# Patient Record
Sex: Male | Born: 1970 | Race: White | Hispanic: No | Marital: Married | Smoking: Former smoker
Health system: Southern US, Community
[De-identification: ages and names within clinical notes are randomized; demographics above are authoritative.]

## PROBLEM LIST (undated history)

## (undated) DIAGNOSIS — I10 Essential (primary) hypertension: Secondary | ICD-10-CM

## (undated) DIAGNOSIS — K219 Gastro-esophageal reflux disease without esophagitis: Secondary | ICD-10-CM

## (undated) HISTORY — PX: NO PAST SURGERIES: SHX2092

## (undated) HISTORY — PX: APPENDECTOMY: SHX54

## (undated) HISTORY — DX: Gastro-esophageal reflux disease without esophagitis: K21.9

## (undated) HISTORY — DX: Essential (primary) hypertension: I10

---

## 2010-09-28 ENCOUNTER — Emergency Department (HOSPITAL_COMMUNITY)
Admission: EM | Admit: 2010-09-28 | Discharge: 2010-09-28 | Payer: Self-pay | Source: Home / Self Care | Admitting: Emergency Medicine

## 2010-10-03 DIAGNOSIS — R079 Chest pain, unspecified: Secondary | ICD-10-CM | POA: Insufficient documentation

## 2010-10-04 ENCOUNTER — Ambulatory Visit: Payer: Self-pay | Admitting: Cardiovascular Disease

## 2010-10-20 ENCOUNTER — Ambulatory Visit
Admission: RE | Admit: 2010-10-20 | Discharge: 2010-10-20 | Payer: Self-pay | Source: Home / Self Care | Attending: Cardiovascular Disease | Admitting: Cardiovascular Disease

## 2010-10-20 ENCOUNTER — Ambulatory Visit: Admission: RE | Admit: 2010-10-20 | Discharge: 2010-10-20 | Payer: Self-pay | Source: Home / Self Care

## 2010-10-27 ENCOUNTER — Ambulatory Visit: Admit: 2010-10-27 | Payer: Self-pay | Admitting: Internal Medicine

## 2010-11-07 ENCOUNTER — Ambulatory Visit: Admit: 2010-11-07 | Payer: Self-pay | Admitting: Cardiovascular Disease

## 2010-11-17 NOTE — Assessment & Plan Note (Signed)
Summary: chest pains/mt   Visit Type:  Initial Consult Primary Maiko Salais:  none  CC:  chest pain.  History of Present Illness: 40 yo WM with no past medical history who is here today cardiac evaluation. He tells me that he was seen in the [facility] ED on 09/28/10 with complaints of left hand tingling, left foot tingling and left chest pain. This pain felt like a heaviness over the left breast and then radiation into the right chest. This pain has been continuous over the last 6 days. His wife has noticed him snoring at night. He has not noticed shortness of breath when exerting himself. He has not had prior GERD. He does not recognize any GERD symptoms. He notices occasional palpitations but no sustained periods. EKG in the ED with NSR, no ischemic changes. Cardiac enzymes negative.   Past History:  Past Medical History: None  Past Surgical History: None  Family History: Mother-alive, healthy Father-alive, healthy 8 siblings-all healthy  Social History: Tobacco Use - No.  Alcohol Use - social Drug Use - no Married-3 children (13,11,8) Contractor-mostly desk work/supervising  Review of Systems       The patient complains of chest pain and palpitations.  The patient denies fatigue, malaise, fever, weight gain/loss, vision loss, decreased hearing, hoarseness, shortness of breath, prolonged cough, wheezing, sleep apnea, coughing up blood, abdominal pain, blood in stool, nausea, vomiting, diarrhea, heartburn, incontinence, blood in urine, muscle weakness, joint pain, leg swelling, rash, skin lesions, headache, fainting, dizziness, depression, anxiety, enlarged lymph nodes, easy bruising or bleeding, and environmental allergies.    Vital Signs:  Patient profile:   40 year old male Height:      72 inches Weight:      176 pounds BMI:     23.96 Pulse rate:   68 / minute Resp:     16 per minute BP sitting:   122 / 92  (left arm)  Vitals Entered By: Marrion Coy, CNA (October 04, 2010 9:10 AM)  Physical Exam  General:  General: Well developed, well nourished, NAD HEENT: OP clear, mucus membranes moist SKIN: warm, dry Neuro: No focal deficits Musculoskeletal: Muscle strength 5/5 all ext Psychiatric: Mood and affect normal Neck: No JVD, no carotid bruits, no thyromegaly, no lymphadenopathy. Lungs:Clear bilaterally, no wheezes, rhonci, crackles CV: RRR no murmurs, gallops rubs Abdomen: soft, NT, ND, BS present Extremities: No edema, pulses 2+.    EKG  Procedure date:  09/28/2010  Findings:      NSR, rate 69 bpm. Normal EKG  Impression & Recommendations:  Problem # 1:  CHEST PAIN (ICD-786.50) Atypical pain. Most likely non-cardiac. ? related to GERD. The pain has been present for 6 days. Most likely non-cardiac. Will start Protonix 40 mg by mouth Qdaily and will arrange exercise treadmill stress test. Follow up in 3-4 weeks.   Orders: Treadmill (Treadmill)  Patient Instructions: 1)  Start  Protonix 40mg  daily 2)  Your physician has requested that you have an exercise tolerance test.  For further information please visit [email or web address]  Please also follow instruction sheet, as given. 3)  Follow up in 4 weeks Prescriptions: PROTONIX 40 MG TBEC (PANTOPRAZOLE SODIUM) Take 1 tablet by mouth once a day  #30 x 6   Entered by:   Meredith Staggers, RN   Authorized by:   Verne Carrow, MD   Signed by:   Meredith Staggers, RN on 10/04/2010   Method used:   Print then Give to Patient   RxID:  [id number]    Appended Document: chest pains/mt    Clinical Lists Changes  Medications: Rx of PROTONIX 40 MG TBEC (PANTOPRAZOLE SODIUM) Take 1 tablet by mouth once a day;  #30 x 6;  Signed;  Entered by: Meredith Staggers, RN;  Authorized by: Verne Carrow, MD;  Method used: Electronically to [id number] [address], [place], [place]  [address], Ph: [phone] or [phone], Fax: [phone]    Prescriptions: PROTONIX 40 MG TBEC  (PANTOPRAZOLE SODIUM) Take 1 tablet by mouth once a day  #30 x 6   Entered by:   Meredith Staggers, RN   Authorized by:   Verne Carrow, MD   Signed by:   Meredith Staggers, RN on 10/04/2010   Method used:   Electronically to        [id number] (retail)       [address]       [place], [place]  [address]       Ph: [phone] or [phone]       Fax: [phone]   RxID:   [id number]

## 2010-12-27 LAB — COMPREHENSIVE METABOLIC PANEL
ALT: 21 U/L (ref 0–53)
AST: 21 U/L (ref 0–37)
BUN: 12 mg/dL (ref 6–23)
Calcium: 9.7 mg/dL (ref 8.4–10.5)
Creatinine, Ser: 0.94 mg/dL (ref 0.4–1.5)
GFR calc Af Amer: >60 mL/min (ref >60)
GFR calc non Af Amer: >60 mL/min (ref >60)
Glucose, Bld: 144 mg/dL — ABNORMAL HIGH (ref 70–99)
Potassium: 4.1 mEq/L (ref 3.5–5.1)
Sodium: 139 mEq/L (ref 135–145)
Total Bilirubin: 0.8 mg/dL (ref 0.3–1.2)
Total Protein: 7.2 g/dL (ref 6.0–8.3)

## 2010-12-27 LAB — CBC
HCT: 41.5 % (ref 39.0–52.0)
Hemoglobin: 13.6 g/dL (ref 13.0–17.0)
MCH: 30.6 pg (ref 26.0–34.0)
MCV: 93.3 fL (ref 78.0–100.0)
RBC: 4.45 MIL/uL (ref 4.22–5.81)
RDW: 13.3 % (ref 11.5–15.5)
WBC: 4.1 10*3/uL (ref 4.0–10.5)

## 2010-12-27 LAB — DIFFERENTIAL
Eosinophils Relative: 1 % (ref 0–5)
Lymphocytes Relative: 41 % (ref 12–46)
Monocytes Relative: 10 % (ref 3–12)
Neutro Abs: 2 10*3/uL (ref 1.7–7.7)

## 2010-12-27 LAB — CK TOTAL AND CKMB (NOT AT ARMC): CK, MB: 2.1 ng/mL (ref 0.3–4.0)

## 2014-10-22 ENCOUNTER — Other Ambulatory Visit (INDEPENDENT_AMBULATORY_CARE_PROVIDER_SITE_OTHER): Payer: BLUE CROSS/BLUE SHIELD

## 2014-10-22 ENCOUNTER — Ambulatory Visit (INDEPENDENT_AMBULATORY_CARE_PROVIDER_SITE_OTHER): Payer: BLUE CROSS/BLUE SHIELD | Admitting: Family

## 2014-10-22 ENCOUNTER — Encounter: Payer: Self-pay | Admitting: Family

## 2014-10-22 VITALS — BP 148/102 | HR 89 | Temp 98.0°F | Resp 18 | Ht 72.0 in | Wt 178.4 lb

## 2014-10-22 DIAGNOSIS — R21 Rash and other nonspecific skin eruption: Secondary | ICD-10-CM | POA: Diagnosis not present

## 2014-10-22 DIAGNOSIS — Z0000 Encounter for general adult medical examination without abnormal findings: Secondary | ICD-10-CM | POA: Insufficient documentation

## 2014-10-22 DIAGNOSIS — R42 Dizziness and giddiness: Secondary | ICD-10-CM | POA: Diagnosis not present

## 2014-10-22 DIAGNOSIS — R5383 Other fatigue: Secondary | ICD-10-CM | POA: Diagnosis not present

## 2014-10-22 LAB — CBC
HCT: 43 % (ref 39.0–52.0)
HEMOGLOBIN: 14 g/dL (ref 13.0–17.0)
MCHC: 32.5 g/dL (ref 30.0–36.0)
MCV: 92 fl (ref 78.0–100.0)
PLATELETS: 272 10*3/uL (ref 150.0–400.0)
RBC: 4.68 Mil/uL (ref 4.22–5.81)
RDW: 13.2 % (ref 11.5–15.5)
WBC: 7.6 10*3/uL (ref 4.0–10.5)

## 2014-10-22 LAB — TSH: TSH: 1.03 u[IU]/mL (ref 0.35–4.50)

## 2014-10-22 LAB — BASIC METABOLIC PANEL
BUN: 15 mg/dL (ref 6–23)
CALCIUM: 9.7 mg/dL (ref 8.4–10.5)
CO2: 30 mEq/L (ref 19–32)
CREATININE: 0.9 mg/dL (ref 0.4–1.5)
Chloride: 100 mEq/L (ref 96–112)
GFR: 98.22 mL/min (ref >60.00)
Glucose, Bld: 101 mg/dL — ABNORMAL HIGH (ref 70–99)
Potassium: 3.9 mEq/L (ref 3.5–5.1)
SODIUM: 138 meq/L (ref 135–145)

## 2014-10-22 LAB — RPR

## 2014-10-22 LAB — HEMOGLOBIN A1C: HEMOGLOBIN A1C: 6.5 % (ref 4.6–6.5)

## 2014-10-22 NOTE — Patient Instructions (Addendum)
Thank you for choosing [facility].  Summary/Instructions:  Please stop by the lab on the basement level of the building for your blood work. Your results will be released to MyChart (or called to you) after review, usually within 72 hours after test completion. If any changes need to be made, you will be notified at that same time.  If your symptoms worsen or fail to improve, please contact our office for further instruction, or in case of emergency go directly to the emergency room at the closest medical facility.   Health Maintenance A healthy lifestyle and preventative care can promote health and wellness.  Maintain regular health, dental, and eye exams.  Eat a healthy diet. Foods like vegetables, fruits, whole grains, low-fat dairy products, and lean protein foods contain the nutrients you need and are low in calories. Decrease your intake of foods high in solid fats, added sugars, and salt. Get information about a proper diet from your health care provider, if necessary.  Regular physical exercise is one of the most important things you can do for your health. Most adults should get at least 150 minutes of moderate-intensity exercise (any activity that increases your heart rate and causes you to sweat) each week. In addition, most adults need muscle-strengthening exercises on 2 or more days a week.   Maintain a healthy weight. The body mass index (BMI) is a screening tool to identify possible weight problems. It provides an estimate of body fat based on height and weight. Your health care provider can find your BMI and can help you achieve or maintain a healthy weight. For males 20 years and older:  A BMI below 18.5 is considered underweight.  A BMI of 18.5 to 24.9 is normal.  A BMI of 25 to 29.9 is considered overweight.  A BMI of 30 and above is considered obese.  Maintain normal blood lipids and cholesterol by exercising and minimizing your intake of saturated fat. Eat a  balanced diet with plenty of fruits and vegetables. Blood tests for lipids and cholesterol should begin at age 20 and be repeated every 5 years. If your lipid or cholesterol levels are high, you are over age 50, or you are at high risk for heart disease, you may need your cholesterol levels checked more frequently.Ongoing high lipid and cholesterol levels should be treated with medicines if diet and exercise are not working.  If you smoke, find out from your health care provider how to quit. If you do not use tobacco, do not start.  Lung cancer screening is recommended for adults aged 55-80 years who are at high risk for developing lung cancer because of a history of smoking. A yearly low-dose CT scan of the lungs is recommended for people who have at least a 30-pack-year history of smoking and are current smokers or have quit within the past 15 years. A pack year of smoking is smoking an average of 1 pack of cigarettes a day for 1 year (for example, a 30-pack-year history of smoking could mean smoking 1 pack a day for 30 years or 2 packs a day for 15 years). Yearly screening should continue until the smoker has stopped smoking for at least 15 years. Yearly screening should be stopped for people who develop a health problem that would prevent them from having lung cancer treatment.  If you choose to drink alcohol, do not have more than 2 drinks per day. One drink is considered to be 12 oz (360 mL) of beer,   5 oz (150 mL) of wine, or 1.5 oz (45 mL) of liquor.  Avoid the use of street drugs. Do not share needles with anyone. Ask for help if you need support or instructions about stopping the use of drugs.  High blood pressure causes heart disease and increases the risk of stroke. Blood pressure should be checked at least every 1-2 years. Ongoing high blood pressure should be treated with medicines if weight loss and exercise are not effective.  If you are 45-79 years old, ask your health care provider if  you should take aspirin to prevent heart disease.  Diabetes screening involves taking a blood sample to check your fasting blood sugar level. This should be done once every 3 years after age 45 if you are at a normal weight and without risk factors for diabetes. Testing should be considered at a younger age or be carried out more frequently if you are overweight and have at least 1 risk factor for diabetes.  Colorectal cancer can be detected and often prevented. Most routine colorectal cancer screening begins at the age of 50 and continues through age 75. However, your health care provider may recommend screening at an earlier age if you have risk factors for colon cancer. On a yearly basis, your health care provider may provide home test kits to check for hidden blood in the stool. A small camera at the end of a tube may be used to directly examine the colon (sigmoidoscopy or colonoscopy) to detect the earliest forms of colorectal cancer. Talk to your health care provider about this at age 50 when routine screening begins. A direct exam of the colon should be repeated every 5-10 years through age 75, unless early forms of precancerous polyps or small growths are found.  People who are at an increased risk for hepatitis B should be screened for this virus. You are considered at high risk for hepatitis B if:  You were born in a country where hepatitis B occurs often. Talk with your health care provider about which countries are considered high risk.  Your parents were born in a high-risk country and you have not received a shot to protect against hepatitis B (hepatitis B vaccine).  You have HIV or AIDS.  You use needles to inject street drugs.  You live with, or have sex with, someone who has hepatitis B.  You are a man who has sex with other men (MSM).  You get hemodialysis treatment.  You take certain medicines for conditions like cancer, organ transplantation, and autoimmune  conditions.  Hepatitis C blood testing is recommended for all people born from 1945 through 1965 and any individual with known risk factors for hepatitis C.  Healthy men should no longer receive prostate-specific antigen (PSA) blood tests as part of routine cancer screening. Talk to your health care provider about prostate cancer screening.  Testicular cancer screening is not recommended for adolescents or adult males who have no symptoms. Screening includes self-exam, a health care provider exam, and other screening tests. Consult with your health care provider about any symptoms you have or any concerns you have about testicular cancer.  Practice safe sex. Use condoms and avoid high-risk sexual practices to reduce the spread of sexually transmitted infections (STIs).  You should be screened for STIs, including gonorrhea and chlamydia if:  You are sexually active and are younger than 24 years.  You are older than 24 years, and your health care provider tells you that you are at   risk for this type of infection.  Your sexual activity has changed since you were last screened, and you are at an increased risk for chlamydia or gonorrhea. Ask your health care provider if you are at risk.  If you are at risk of being infected with HIV, it is recommended that you take a prescription medicine daily to prevent HIV infection. This is called pre-exposure prophylaxis (PrEP). You are considered at risk if:  You are a man who has sex with other men (MSM).  You are a heterosexual man who is sexually active with multiple partners.  You take drugs by injection.  You are sexually active with a partner who has HIV.  Talk with your health care provider about whether you are at high risk of being infected with HIV. If you choose to begin PrEP, you should first be tested for HIV. You should then be tested every 3 months for as long as you are taking PrEP.  Use sunscreen. Apply sunscreen liberally and  repeatedly throughout the day. You should seek shade when your shadow is shorter than you. Protect yourself by wearing long sleeves, pants, a wide-brimmed hat, and sunglasses year round whenever you are outdoors.  Tell your health care provider of new moles or changes in moles, especially if there is a change in shape or color. Also, tell your health care provider if a mole is larger than the size of a pencil eraser.  A one-time screening for abdominal aortic aneurysm (AAA) and surgical repair of large AAAs by ultrasound is recommended for men aged 65-75 years who are current or former smokers.  Stay current with your vaccines (immunizations). Document Released: 03/30/2008 Document Revised: 10/07/2013 Document Reviewed: 02/27/2011 ExitCare Patient Information 2015 [employer]. This information is not intended to replace advice given to you by your health care provider. Make sure you discuss any questions you have with your health care provider.  

## 2014-10-22 NOTE — Progress Notes (Signed)
Subjective:    Patient ID: William SquibbChristopher P Adelsberger, male    DOB: 1971-09-05, 44 y.o.   MRN: [id number]  Chief Complaint  Patient presents with  . Establish Care    wants a physical, not fasting    HPI:  William Daniel is a 44 y.o. male who presents today for an annual wellness visit.   1) Health Maintenance - Rates overall good/ok health   Diet - regular diet; fairly poor and has room for improvements; is working on it; 50% eating out Exercise - Occasional - likes to run; usually runs 10-20 miles per week.    2) Preventative Exams / Immunizations:  Dental -- Due for exam Vision -- Due for exam   Health Maintenance  Topic Date Due  . INFLUENZA VACCINE  07/17/2015 (Originally 05/16/2014)  . TETANUS/TDAP  09/16/2022   There is no immunization history on file for this patient.   1) Dizziness -  Indicates symptoms of dizziness consistent with lightheadedness and possible feeling disconnected from the environment. Occurs when he is standing up. Has not tried any treatments and is unsure of any alleviating factors.   2) STD - Itchy rash like symptoms started about a year ago and  in the groin area and on his penis. Has not tried any treatments or been evaluated for this condition. Also noted he had an open spot on the inside of his mouth. Has never been tested for anything. Denies any discharge or pain. Marland Kitchen.  2) Fatigue - Has been going on for about 2 months; Describes overall general feelings of tiredness.   No Known Allergies  No current outpatient prescriptions on file prior to visit.   No current facility-administered medications on file prior to visit.    History reviewed. No pertinent past medical history.  History reviewed. No pertinent past surgical history.  Family History  Problem Relation Age of Onset  . Diabetes Father   . Heart disease Father   . Alcohol abuse Paternal Grandfather     History   Social History  . Marital Status: Married    Spouse  Name: N/A    Number of Children: 3  . Years of Education: 16   Occupational History  . Self Employed    Social History Main Topics  . Smoking status: Former Smoker    Types: Cigarettes  . Smokeless tobacco: Former NeurosurgeonUser    Types: Chew  . Alcohol Use: 0.0 oz/week    0 Not specified per week     Comment: occasional  . Drug Use: No  . Sexual Activity: Not on file   Other Topics Concern  . Not on file   Social History Narrative   [place] in [place] and raised in [place]. Currently resides in a house with wife and children. 2 dogs. Fun:  Play sports, run, coach basketball.   Denies religious beliefs effecting healthcare.     Review of Systems  Constitutional: Denies fever, chills or significant weight gain/loss. HENT: Head: Denies headache or neck pain Ears: Denies changes in hearing, ringing in ears, earache, drainage Nose: Denies discharge, stuffiness, itching, nosebleed, sinus pain Throat: Denies sore throat, hoarseness, dry mouth, sores, thrush Eyes: Denies loss/changes in vision, pain, redness, blurry/double vision, flashing lights Cardiovascular: Denies chest pain/discomfort, tightness, palpitations, shortness of breath with activity, difficulty lying down, swelling, sudden awakening with shortness of breath Respiratory: Denies shortness of breath, cough, sputum production, wheezing Gastrointestinal: Denies dysphasia, heartburn, change in appetite, nausea, change in bowel habits, rectal bleeding,  constipation, diarrhea, yellow skin or eyes Genitourinary: Denies frequency, urgency, burning/pain, blood in urine, incontinence, change in urinary strength. Musculoskeletal: Denies muscle/joint pain, stiffness, back pain, redness or swelling of joints, trauma Skin: Denies lumps, dryness, color changes, or hair/nail changes Neurological: Denies  fainting, seizures, weakness, numbness, tingling, tremor Psychiatric - Denies nervousness, stress, depression or memory loss Endocrine: Denies heat or  cold intolerance, sweating, frequent urination, excessive thirst, changes in appetite Hematologic: Denies ease of bruising or bleeding     Objective:     BP 148/102 mmHg  Pulse 89  Temp(Src) 98 F (36.7 C) (Oral)  Resp 18  Ht 6' (1.829 m)  Wt 178 lb 6.4 oz (80.922 kg)  BMI 24.19 kg/m2  SpO2 97% Nursing note and vital signs reviewed.  Physical Exam  Constitutional: He is oriented to person, place, and time. He appears well-developed and well-nourished.  HENT:  Head: Normocephalic.  Right Ear: Hearing, tympanic membrane, external ear and ear canal normal.  Left Ear: Hearing, tympanic membrane, external ear and ear canal normal.  Nose: Nose normal.  Mouth/Throat: Uvula is midline, oropharynx is clear and moist and mucous membranes are normal.  Eyes: Conjunctivae and EOM are normal. Pupils are equal, round, and reactive to light.  Neck: Neck supple. No JVD present. No tracheal deviation present. No thyromegaly present.  Cardiovascular: Normal rate, regular rhythm, normal heart sounds and intact distal pulses.   Pulmonary/Chest: Effort normal and breath sounds normal.  Abdominal: Soft. Bowel sounds are normal. He exhibits no distension and no mass. There is no tenderness. There is no rebound and no guarding.  Genitourinary:  Several small ruptured vesicles along anterior and posterior shaft of penis. No discharge noted.   Musculoskeletal: Normal range of motion. He exhibits no edema or tenderness.  Lymphadenopathy:    He has no cervical adenopathy.  Neurological: He is alert and oriented to person, place, and time. He has normal reflexes. No cranial nerve deficit. He exhibits normal muscle tone. Coordination normal.  Skin: Skin is warm and dry.  Psychiatric: He has a normal mood and affect. His behavior is normal. Judgment and thought content normal.       Assessment & Plan:

## 2014-10-22 NOTE — Assessment & Plan Note (Signed)
1) Anticipatory Guidance: Discussed importance of wearing a seatbelt while driving and not texting while driving; changing batteries in smoke detector at least once annually; wearing suntan lotion when outside; eating a balanced and moderate diet; getting physical activity at least 30 minutes per day.  2) Immunizations / Screenings / Labs:  Patient declines flu shot. All other immunizations are up to date per recommendations. All preventative screenings are up to date per recommendations.  btain CBC, BMET,and TSH.   Overall well exam. Patient risk factors include risky sexual behavior for which he is being tested. Discussed importance of nutrient density and re-starting his physical activity. Follow up prevention exam in 1 year or sooner, pending lab work.

## 2014-10-22 NOTE — Assessment & Plan Note (Signed)
Idiopathic at this time given normal physical exam. Obtain BMET and CBC. Consider further cardiac evaluation if events continue to occur.

## 2014-10-22 NOTE — Assessment & Plan Note (Signed)
No obvious cause of fatigue. Obtain BMET, CBC, A1c and TSH to rule out metabolic causes. If no results are evident consider referral to cardiology for further evaluation and possible anxiety or depression.

## 2014-10-22 NOTE — Assessment & Plan Note (Signed)
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 Obtain STD lab work - GC/Chlamydia, RPR, HIV and HSV 1/2. Continue supportive care at this time. If new outbreaks occur or would like prevention consider starting valacyclovir or acyclovir. Follow up if symptoms worsen or fail to improve.

## 2014-10-22 NOTE — Progress Notes (Signed)
Pre visit review using our clinic review tool, if applicable. No additional management support is needed unless otherwise documented below in the visit note. 

## 2014-10-23 ENCOUNTER — Encounter: Payer: Self-pay | Admitting: Family

## 2014-10-23 LAB — HSV 2 ANTIBODY, IGG: HSV 2 Glycoprotein G Ab, IgG: <0.1 IV

## 2014-10-23 LAB — HSV 1 ANTIBODY, IGG: HSV 1 Glycoprotein G Ab, IgG: 1.74 IV — ABNORMAL HIGH

## 2014-10-23 LAB — HIV ANTIBODY (ROUTINE TESTING W REFLEX): HIV: NONREACTIVE

## 2014-10-24 LAB — GC/CHLAMYDIA PROBE AMP, URINE
CHLAMYDIA, SWAB/URINE, PCR: NEGATIVE
GC Probe Amp, Urine: NEGATIVE

## 2015-01-22 ENCOUNTER — Ambulatory Visit (INDEPENDENT_AMBULATORY_CARE_PROVIDER_SITE_OTHER): Payer: BLUE CROSS/BLUE SHIELD | Admitting: Physician Assistant

## 2015-01-22 VITALS — BP 138/96 | HR 65 | Temp 97.7°F | Resp 16 | Ht 71.0 in | Wt 182.2 lb

## 2015-01-22 DIAGNOSIS — R030 Elevated blood-pressure reading, without diagnosis of hypertension: Secondary | ICD-10-CM

## 2015-01-22 DIAGNOSIS — R490 Dysphonia: Secondary | ICD-10-CM

## 2015-01-22 DIAGNOSIS — I10 Essential (primary) hypertension: Secondary | ICD-10-CM

## 2015-01-22 DIAGNOSIS — R070 Pain in throat: Secondary | ICD-10-CM

## 2015-01-22 DIAGNOSIS — R079 Chest pain, unspecified: Secondary | ICD-10-CM

## 2015-01-22 DIAGNOSIS — IMO0001 Reserved for inherently not codable concepts without codable children: Secondary | ICD-10-CM

## 2015-01-22 HISTORY — DX: Essential (primary) hypertension: I10

## 2015-01-22 MED ORDER — OMEPRAZOLE 20 MG PO CPDR
20.0000 mg | DELAYED_RELEASE_CAPSULE | Freq: Every day | ORAL | Status: DC
Start: 2015-01-22 — End: 2015-04-05

## 2015-01-22 MED ORDER — HYDROCHLOROTHIAZIDE 12.5 MG PO CAPS: 12.5000 mg | ORAL_CAPSULE | Freq: Every day | ORAL | Status: DC

## 2015-01-22 NOTE — Progress Notes (Signed)
Subjective:    Patient ID: William Daniel, male    DOB: 1971/05/31, 44 y.o.   MRN: [id number]  HPI  This is a 44 year old male who is presenting with 3 weeks of sore throat and hoarseness. Hoarseness worse in the morning and improves throughout the day. Reports 6 months ago he had a respiratory illness with cough and mucous production. He reports those symptoms resolved but ever since then has felt the need to clear his throat repeatedly. Sore throat is located near base of neck, described as dull. A few weeks ago he wore a button up collar shirt for a special event and noted it felt tighter than usual. He reports he has had dull constant CP x 1 month. Located mid-sternal. No associated symptoms. Nothing makes better or worse. He runs a couple miles 2-3 times a week and has no problem with CP or SOB. Reports 5 yeares ago he went to the ED for CP and had stress test and normal. Since then he will have intermittent CP that is very similar to present CP although it doesn't usually last this long. He denies problems with heartburn. He denies environmental allergies. Hasn't tried anything for current symptoms. He is a former smoker, quit 10 years ago. He chews tobacco once a month when [place].   Pt has had elevated BP for years. Never been on anything. He was recently at the orthopedists' office and he states his BP was "150 something over 120 something". They recommended he see his primary. He states he does not have a primary.  Labs drawn 3 months ago show: normal CMP, TSH, CBC. A1C 6.5. Negative STD panel.  Review of Systems  Constitutional: Negative for fever and chills.  HENT: Positive for sore throat and voice change. Negative for congestion, ear pain, postnasal drip, sinus pressure and trouble swallowing.   Respiratory: Negative for cough and shortness of breath.   Cardiovascular: Positive for chest pain. Negative for palpitations and leg swelling.  Gastrointestinal:  Negative for nausea, vomiting, abdominal pain and diarrhea.  Skin: Negative for rash.  Allergic/Immunologic: Negative for environmental allergies.  Neurological: Negative for dizziness, syncope, weakness, numbness and headaches.  Hematological: Negative for adenopathy.   Patient Active Problem List   Diagnosis Date Noted  . Rash and nonspecific skin eruption 10/22/2014  . Dizziness 10/22/2014  . Fatigue 10/22/2014  . CHEST PAIN 10/03/2010   Prior to Admission medications   Not on File   No Known Allergies  Patient's social and family history were reviewed.     Objective:   Physical Exam  Constitutional: He is oriented to person, place, and time. He appears well-developed and well-nourished. No distress.  HENT:  Head: Normocephalic and atraumatic.  Right Ear: Hearing, tympanic membrane, external ear and ear canal normal.  Left Ear: Hearing, tympanic membrane, external ear and ear canal normal.  Nose: Nose normal.  Mouth/Throat: Uvula is midline and mucous membranes are normal. Posterior oropharyngeal erythema present. No oropharyngeal exudate or posterior oropharyngeal edema.  Eyes: Conjunctivae and lids are normal. Right eye exhibits no discharge. Left eye exhibits no discharge. No scleral icterus.  Neck: No thyromegaly present.  Cardiovascular: Normal rate, regular rhythm, normal heart sounds, intact distal pulses and normal pulses.   No murmur heard. Pulmonary/Chest: Effort normal and breath sounds normal. No respiratory distress. He has no wheezes. He has no rhonchi. He has no rales. He exhibits no tenderness.  Abdominal: Soft. Normal appearance. There is  no tenderness.  Musculoskeletal: Normal range of motion.  Lymphadenopathy:       Head (right side): No submental, no submandibular and no tonsillar adenopathy present.       Head (left side): No submental, no submandibular and no tonsillar adenopathy present.    He has no cervical adenopathy.       Right: No  supraclavicular adenopathy present.       Left: No supraclavicular adenopathy present.  Neurological: He is alert and oriented to person, place, and time.  Skin: Skin is warm, dry and intact. No lesion and no rash noted.  Psychiatric: He has a normal mood and affect. His speech is normal and behavior is normal. Thought content normal.   BP 138/96 mmHg  Pulse 65  Temp(Src) 97.7 F (36.5 C) (Oral)  Resp 16  Ht 5\' 11"  (1.803 m)  Wt 182 lb 3.2 oz (82.645 kg)  BMI 25.42 kg/m2  SpO2 98%  EKG interpreted by Dr. Perrin MalteseGuest: NSR    Assessment & Plan:  1. Chest pain, unspecified chest pain type 2. Hoarseness 3. Throat pain EKG normal which is reassuring. Also reassuring that he does not have problems with running and he had a normal stress test 5 years ago. Suspect GERD involvement although pt reports several worrisome symptoms including hoarseness and neck swelling. In addition he has a history of tobacco use. He will start taking prilosec daily. Made referral to ENT for further evaluation.  - EKG 12-Lead - omeprazole (PRILOSEC) 20 MG capsule; Take 1 capsule (20 mg total) by mouth daily.  Dispense: 30 capsule; Refill: 3 - Ambulatory referral to ENT  2. Elevated BP Pt will start taking HCTZ daily for elevated BP. Advised he buy a BP monitor and record readings 2-3 times a week. Counseled on low sodium diet and exercise. He will return in 1 month for follow up. - hydrochlorothiazide (MICROZIDE) 12.5 MG capsule; Take 1 capsule (12.5 mg total) by mouth daily.  Dispense: 30 capsule; Refill: 1  Nicole V. Dyke BrackettBush, PA-C, MHS [facility]  01/22/2015

## 2015-01-22 NOTE — Patient Instructions (Signed)
Take hydrochlorithiazide in the mornings. Take prilosec in the mornings 30 minutes before breakfast. Buy a blood pressure monitor and keep a log of your home BPs. You will get a phone call to make appointment with ENT. No more chewing tobacco!! Watch the salt in your food. Return to see me in 1 month.

## 2015-02-21 ENCOUNTER — Encounter: Payer: Self-pay | Admitting: Physician Assistant

## 2015-02-21 DIAGNOSIS — K219 Gastro-esophageal reflux disease without esophagitis: Secondary | ICD-10-CM | POA: Insufficient documentation

## 2015-02-21 HISTORY — DX: Gastro-esophageal reflux disease without esophagitis: K21.9

## 2015-03-02 ENCOUNTER — Ambulatory Visit: Payer: BLUE CROSS/BLUE SHIELD | Admitting: Physician Assistant

## 2015-04-05 ENCOUNTER — Ambulatory Visit (INDEPENDENT_AMBULATORY_CARE_PROVIDER_SITE_OTHER): Payer: BLUE CROSS/BLUE SHIELD | Admitting: Physician Assistant

## 2015-04-05 VITALS — BP 140/90 | HR 71 | Temp 98.6°F | Resp 18 | Ht 71.0 in | Wt 185.5 lb

## 2015-04-05 DIAGNOSIS — R208 Other disturbances of skin sensation: Secondary | ICD-10-CM | POA: Diagnosis not present

## 2015-04-05 DIAGNOSIS — R7302 Impaired glucose tolerance (oral): Secondary | ICD-10-CM | POA: Diagnosis not present

## 2015-04-05 DIAGNOSIS — I10 Essential (primary) hypertension: Secondary | ICD-10-CM

## 2015-04-05 DIAGNOSIS — R200 Anesthesia of skin: Secondary | ICD-10-CM

## 2015-04-05 LAB — POCT GLYCOSYLATED HEMOGLOBIN (HGB A1C): HEMOGLOBIN A1C: 5.9

## 2015-04-05 MED ORDER — LISINOPRIL 10 MG PO TABS: 10.0000 mg | ORAL_TABLET | Freq: Every day | ORAL | Status: DC

## 2015-04-05 NOTE — Progress Notes (Signed)
[facility] [address], [place] [address] [phone]  Date:  04/05/2015   Name:  William Daniel   DOB:  07-05-1971   MRN:  [id number]  PCP:  Jeanine Luz, FNP    Chief Complaint: Hypertension   History of Present Illness:  This is a 44 y.o. male with PMH HTN, GERD, chest pain who is presenting wanting to discuss BP. At last visit I prescribed HCTZ 12.5 mg for elevated BP. States he was checking his BP every every 2-3 weeks when he was at the [facility] and usually 130s/90s. He ran out of the medication 4 days ago. Today he checked his BP at 155/100. This was very worrisome for him. He denies CP, SOB, palpitations, headache, dizziness.   At last visit pt was complaining of hoarseness and sensation of neck swelling and globus sensation. Work up negative. Suspected GERD and started him on prilosec 20 mg. I referred him to GI who did laryngoscopy and suspect GERD as well. Prilosec dose was increased to 40 mg QD. This visit was 5-6 weeks ago. Pt states hoarseness is improving but not resolved yet. ENT told him it could take 3 months to return to normal.  Pt complaining that he wakes during the night with bilateral hand numbness/tingling. This has been going on for several months. States he has to hang his hands by his side to get them to stop tingling. Pt works a Office manager. He does not have numbness during the day. 6 months ago he had a1c 6.5, normal CMP, CBC and TSH. Pt worried because he has gained 10 pounds in the past 6 months. Reports he runs 10 miles 2-3 times a week. He does note he has been more sedentary at work lately. He has been trying to eat healthier and has cut out sweets. States his dad died in his early 79s from DM complications.  Review of Systems:  Review of Systems See HPI  Patient Active Problem List   Diagnosis Date Noted  . GERD (gastroesophageal reflux disease) 02/21/2015  . Essential hypertension 01/22/2015  . Rash and nonspecific skin  eruption 10/22/2014  . Dizziness 10/22/2014  . Fatigue 10/22/2014  . CHEST PAIN 10/03/2010    Prior to Admission medications   Medication Sig Start Date End Date Taking? Authorizing Provider  omeprazole (PRILOSEC) 40 MG capsule Take 40 mg by mouth daily.   Yes Historical Provider, MD  hydrochlorothiazide (MICROZIDE) 12.5 MG capsule Take 1 capsule (12.5 mg total) by mouth daily. Patient not taking: Reported on 04/05/2015 01/22/15   Dorna Leitz, PA-C    No Known Allergies  Past Surgical History  Procedure Laterality Date  . No past surgeries      History  Substance Use Topics  . Smoking status: Former Smoker    Types: Cigarettes  . Smokeless tobacco: Former Neurosurgeon    Types: Chew  . Alcohol Use: 0.0 oz/week    0 Standard drinks or equivalent per week     Comment: occasional    Family History  Problem Relation Age of Onset  . Diabetes Father   . Heart disease Father   . Alcohol abuse Paternal Grandfather     Medication list has been reviewed and updated.  Physical Examination:  Physical Exam  Constitutional: He is oriented to person, place, and time. He appears well-developed and well-nourished. No distress.  HENT:  Head: Normocephalic and atraumatic.  Right Ear: Hearing normal.  Left Ear: Hearing normal.  Nose:  Nose normal.  Eyes: Conjunctivae and lids are normal. Right eye exhibits no discharge. Left eye exhibits no discharge. No scleral icterus.  Neck: Carotid bruit is not present.  Cardiovascular: Normal rate, regular rhythm, normal heart sounds and normal pulses.   No murmur heard. Pulmonary/Chest: Effort normal and breath sounds normal. No respiratory distress. He has no wheezes. He has no rhonchi. He has no rales.  Musculoskeletal: Normal range of motion.  Negative tinel's and phalens test. Positive durkan's test. No sx exacerbation with tapping over ulnar cubital tunnels.  Neurological: He is alert and oriented to person, place, and time. He has normal  strength. No sensory deficit.  Skin: Skin is warm, dry and intact. No lesion and no rash noted.  Psychiatric: He has a normal mood and affect. His speech is normal and behavior is normal. Thought content normal.   BP 140/90 mmHg  Pulse 71  Temp(Src) 98.6 F (37 C) (Oral)  Resp 18  Ht 5\' 11"  (1.803 m)  Wt 185 lb 8 oz (84.142 kg)  BMI 25.88 kg/m2  SpO2 98%  Results for orders placed or performed in visit on 04/05/15  POCT glycosylated hemoglobin (Hb A1C)  Result Value Ref Range   Hemoglobin A1C 5.9    Assessment and Plan:  1. Essential hypertension Pt wants to try a different BP medication. Will try lisinopril 10 mg QD. Pt will buy a BP monitor and take BP at home 2-3 times a week. In 4 weeks will send me measurements via mychart and will determine if any changes are needed. CBC and CMP pending. Return in 3 months for follow up. - CBC - Comprehensive metabolic panel - lisinopril (PRINIVIL,ZESTRIL) 10 MG tablet; Take 1 tablet (10 mg total) by mouth daily.  Dispense: 30 tablet; Refill: 2  2. Impaired glucose tolerance A1C 5.9, down from 6.5 six months ago. - POCT glycosylated hemoglobin (Hb A1C)  3. Bilateral hand numbness Suspect carpal tunnel as a possible cause. Fit pt for wrist splint that he will try wearing on one of his arms at night and see if it helps.   [place] Brackett, MHS [facility]  04/05/2015

## 2015-04-05 NOTE — Patient Instructions (Signed)
Start taking lisinopril 10 mg every day. Buy a BP monitor and take BP at least 2-3 times a week. In 1 month send me a message on mychart with your BP readings and we will determine if we need to up your dose or make any changes. I will call you with the results of your lab tests. Start exercising more regularly. Watch the salt in your diet - no more than 2 gm per day. Watch white foods in your diet - that includes potatoes, rice, bread, pasta - as well as sweets and sugary beverages. Start wearing a wrist brace on one of your arms at night and see if this helps. Return in 3 months for follow up.

## 2015-04-06 LAB — COMPREHENSIVE METABOLIC PANEL
ALK PHOS: 62 U/L (ref 39–117)
ALT: 23 U/L (ref 0–53)
AST: 30 U/L (ref 0–37)
Albumin: 4.3 g/dL (ref 3.5–5.2)
BUN: 17 mg/dL (ref 6–23)
CO2: 26 mEq/L (ref 19–32)
Calcium: 9.7 mg/dL (ref 8.4–10.5)
Chloride: 101 mEq/L (ref 96–112)
Creat: 0.8 mg/dL (ref 0.50–1.35)
GLUCOSE: 102 mg/dL — AB (ref 70–99)
POTASSIUM: 4.2 meq/L (ref 3.5–5.3)
Sodium: 138 mEq/L (ref 135–145)
TOTAL PROTEIN: 7.6 g/dL (ref 6.0–8.3)
Total Bilirubin: 0.5 mg/dL (ref 0.2–1.2)

## 2015-04-06 LAB — CBC
HCT: 41 % (ref 39.0–52.0)
Hemoglobin: 14 g/dL (ref 13.0–17.0)
MCH: 30.4 pg (ref 26.0–34.0)
MCHC: 34.1 g/dL (ref 30.0–36.0)
MCV: 89.1 fL (ref 78.0–100.0)
MPV: 10.2 fL (ref 8.6–12.4)
PLATELETS: 173 10*3/uL (ref 150–400)
RBC: 4.6 MIL/uL (ref 4.22–5.81)
RDW: 14.3 % (ref 11.5–15.5)
WBC: 6.3 10*3/uL (ref 4.0–10.5)

## 2015-04-07 ENCOUNTER — Other Ambulatory Visit: Payer: Self-pay | Admitting: Physician Assistant

## 2015-08-10 ENCOUNTER — Encounter: Payer: Self-pay | Admitting: Family

## 2015-08-10 ENCOUNTER — Ambulatory Visit (INDEPENDENT_AMBULATORY_CARE_PROVIDER_SITE_OTHER): Payer: BLUE CROSS/BLUE SHIELD | Admitting: Family

## 2015-08-10 VITALS — BP 138/82 | HR 66 | Temp 98.0°F | Resp 18 | Ht 71.0 in | Wt 182.0 lb

## 2015-08-10 DIAGNOSIS — B356 Tinea cruris: Secondary | ICD-10-CM

## 2015-08-10 MED ORDER — CLOTRIMAZOLE-BETAMETHASONE 1-0.05 % EX CREA: 1.0000 "application " | TOPICAL_CREAM | Freq: Two times a day (BID) | CUTANEOUS | Status: DC

## 2015-08-10 NOTE — Progress Notes (Signed)
Pre visit review using our clinic review tool, if applicable. No additional management support is needed unless otherwise documented below in the visit note. 

## 2015-08-10 NOTE — Assessment & Plan Note (Signed)
Symptoms and exam consistent with tinea cruris. Start lotrisone. Follow up if symptoms worsen or fail to improve after about 1 week.

## 2015-08-10 NOTE — Progress Notes (Signed)
   Subjective:    Patient ID: William Daniel, male    DOB: 1971/08/22, 44 y.o.   MRN: [id number]  Chief Complaint  Patient presents with  . Follow-up    Has a rash in the groin area that has been going on for 2 months and is getting worse wants to see about getting a medication for it    HPI:  William Daniel is a 44 y.o. male who  has a past medical history of GERD (gastroesophageal reflux disease) (02/21/2015) and Essential hypertension (01/22/2015). and presents today for an acute office visit.  Assocated symptom of a rash located in his groin and has been going on for about 2 months. Described as itchy, burning and red. Has slowly been getting worse over the past several months. Modifying factors include cortisone and diaper rash cream. Does run a lot and and wears both boxers and briefs. Feels better when there is pressure on the area.   No Known Allergies   No current outpatient prescriptions on file prior to visit.   No current facility-administered medications on file prior to visit.    Review of Systems  Constitutional: Negative for fever and chills.  Skin: Positive for rash.      Objective:    BP 138/82 mmHg  Pulse 66  Temp(Src) 98 F (36.7 C) (Oral)  Resp 18  Ht 5\' 11"  (1.803 m)  Wt 182 lb (82.555 kg)  BMI 25.40 kg/m2  SpO2 97% Nursing note and vital signs reviewed.  Physical Exam  Constitutional: He is oriented to person, place, and time. He appears well-developed and well-nourished. No distress.  Cardiovascular: Normal rate, regular rhythm, normal heart sounds and intact distal pulses.   Pulmonary/Chest: Effort normal and breath sounds normal.  Neurological: He is alert and oriented to person, place, and time.  Skin: Skin is warm and dry.  Red/purplish scaly rash located on both sides of his groin with no discharge.   Psychiatric: He has a normal mood and affect. His behavior is normal. Judgment and thought content normal.       Assessment &  Plan:   Problem List Items Addressed This Visit      Musculoskeletal and Integument   Tinea cruris - Primary    Symptoms and exam consistent with tinea cruris. Start lotrisone. Follow up if symptoms worsen or fail to improve after about 1 week.       Relevant Medications   clotrimazole-betamethasone (LOTRISONE) cream

## 2015-08-10 NOTE — Patient Instructions (Signed)
Thank you for choosing [facility].  Summary/Instructions:  Your prescription(s) have been submitted to your pharmacy or been printed and provided for you. Please take as directed and contact our office if you believe you are having problem(s) with the medication(s) or have any questions.  If your symptoms worsen or fail to improve, please contact our office for further instruction, or in case of emergency go directly to the emergency room at the closest medical facility.   Jock Itch Jock itch (tinea cruris) is a fungal infection of the skin in the groin area. It is sometimes called ringworm, even though it is not caused by worms. It is caused by a fungus, which is a type of germ that thrives in dark, damp places. Jock itch causes a rash and itching in the groin and upper thigh area. It usually goes away in 2-3 weeks with treatment. CAUSES The fungus that causes jock itch may be spread by:  Touching a fungus infection elsewhere on your body--such as athlete's foot--and then touching your groin area.  Sharing towels or clothing with an infected person. RISK FACTORS Jock itch is most common in men and adolescent boys. This condition is more likely to develop from:  Being in hot, humid climates.  Wearing tight-fitting clothing or wet bathing suits for long periods of time.  Participating in sports.  Being overweight.  Having diabetes. SYMPTOMS Symptoms of jock itch may include:  A red, pink, or brown rash in the groin area. The rash may spread to the thighs, anus, and buttocks.  Dry and scaly skin on or around the rash.  Itchiness. DIAGNOSIS Most often, a health care provider can make the diagnosis by looking at your rash. Sometimes, a scraping of the infected skin will be taken. This sample may be tested by looking at it under a microscope or by trying to grow the fungus from the sample (culture).  TREATMENT Treatment for this condition may include:  Antifungal medicine  to kill the fungus. This may be in various forms:  Skin cream or ointment.  Medicine taken by mouth.  Skin cream or ointment to reduce the itching.  Compresses or medicated powders to dry the infected skin. HOME CARE INSTRUCTIONS  Take medicines only as directed by your health care provider. Apply skin creams or ointments exactly as directed.  Wear loose-fitting clothing.  Men should wear cotton boxer shorts.  Women should wear cotton underwear.  Change your underwear every day to keep your groin dry.  Avoid hot baths.  Dry your groin area well after bathing.  Use a separate towel to dry your groin area. This will help to prevent a spreading of the infection to other areas of your body.  Do not scratch the affected area.  Do not share towels with other people. SEEK MEDICAL CARE IF:  Your rash does not improve or it gets worse after 2 weeks of treatment.  Your rash is spreading.  Your rash returns after treatment is finished.  You have a fever.  You have redness, swelling, or pain in the area around your rash.  You have fluid, blood, or pus coming from your rash.  Your have your rash for more than 4 weeks.   This information is not intended to replace advice given to you by your health care provider. Make sure you discuss any questions you have with your health care provider.   Document Released: 09/22/2002 Document Revised: 10/23/2014 Document Reviewed: 07/14/2014 Elsevier Interactive Patient Education [employer]2016 Else[employer] Inc.

## 2016-07-10 ENCOUNTER — Other Ambulatory Visit: Payer: Self-pay | Admitting: Family Medicine

## 2016-07-10 ENCOUNTER — Ambulatory Visit
Admission: RE | Admit: 2016-07-10 | Discharge: 2016-07-10 | Disposition: A | Discharge status: Home / Self Care | Payer: BLUE CROSS/BLUE SHIELD | Source: Ambulatory Visit | Attending: Family Medicine | Admitting: Family Medicine

## 2016-07-10 DIAGNOSIS — R140 Abdominal distension (gaseous): Secondary | ICD-10-CM

## 2016-07-10 DIAGNOSIS — R109 Unspecified abdominal pain: Secondary | ICD-10-CM

## 2017-08-06 ENCOUNTER — Encounter (HOSPITAL_COMMUNITY): Payer: Self-pay | Admitting: Emergency Medicine

## 2017-08-06 DIAGNOSIS — Z5321 Procedure and treatment not carried out due to patient leaving prior to being seen by health care provider: Secondary | ICD-10-CM | POA: Insufficient documentation

## 2017-08-06 DIAGNOSIS — H5789 Other specified disorders of eye and adnexa: Secondary | ICD-10-CM | POA: Insufficient documentation

## 2017-08-06 NOTE — ED Triage Notes (Signed)
Patient reports around 8pm got a piece of a metal shaving in his right eye. Pt attempted to flush his eye out in shower for about 10 mins with no relief. Redness and irritation noted. Having difficulty seeing out of eye.

## 2017-08-07 ENCOUNTER — Emergency Department (HOSPITAL_COMMUNITY)
Admission: EM | Admit: 2017-08-07 | Discharge: 2017-08-07 | Disposition: A | Discharge status: Home / Self Care | Payer: BLUE CROSS/BLUE SHIELD | Attending: Emergency Medicine | Admitting: Emergency Medicine

## 2017-08-07 MED ORDER — FLUORESCEIN SODIUM 1 MG OP STRP
1.0000 | ORAL_STRIP | Freq: Once | OPHTHALMIC | Status: DC
Start: 2017-08-07 — End: 2017-08-07
  Filled 2017-08-07: qty 1

## 2017-08-07 MED ORDER — TETRACAINE HCL 0.5 % OP SOLN: 2.0000 [drp] | Freq: Once | OPHTHALMIC | Status: DC

## 2017-08-07 NOTE — ED Notes (Signed)
Called patient x 3 and no answer. 

## 2018-08-20 DIAGNOSIS — M1990 Unspecified osteoarthritis, unspecified site: Secondary | ICD-10-CM | POA: Diagnosis not present

## 2018-08-20 DIAGNOSIS — M7702 Medial epicondylitis, left elbow: Secondary | ICD-10-CM | POA: Diagnosis not present

## 2018-08-20 DIAGNOSIS — M216X2 Other acquired deformities of left foot: Secondary | ICD-10-CM | POA: Diagnosis not present

## 2018-09-02 DIAGNOSIS — M722 Plantar fascial fibromatosis: Secondary | ICD-10-CM | POA: Diagnosis not present

## 2019-01-02 DIAGNOSIS — J069 Acute upper respiratory infection, unspecified: Secondary | ICD-10-CM | POA: Diagnosis not present

## 2019-01-02 DIAGNOSIS — Z719 Counseling, unspecified: Secondary | ICD-10-CM | POA: Diagnosis not present

## 2019-07-09 ENCOUNTER — Encounter: Payer: Self-pay | Admitting: Registered Nurse

## 2019-07-09 ENCOUNTER — Ambulatory Visit (INDEPENDENT_AMBULATORY_CARE_PROVIDER_SITE_OTHER): Payer: 59

## 2019-07-09 ENCOUNTER — Other Ambulatory Visit: Payer: Self-pay

## 2019-07-09 ENCOUNTER — Ambulatory Visit: Payer: 59 | Admitting: Registered Nurse

## 2019-07-09 VITALS — BP 142/88 | HR 83 | Temp 98.0°F | Resp 16 | Ht 71.85 in | Wt 176.0 lb

## 2019-07-09 DIAGNOSIS — R197 Diarrhea, unspecified: Secondary | ICD-10-CM

## 2019-07-09 DIAGNOSIS — M542 Cervicalgia: Secondary | ICD-10-CM

## 2019-07-09 MED ORDER — AZITHROMYCIN 500 MG PO TABS: 500.0000 mg | ORAL_TABLET | Freq: Every day | ORAL | 0 refills | Status: DC

## 2019-07-09 MED ORDER — CYCLOBENZAPRINE HCL 5 MG PO TABS: 5.0000 mg | ORAL_TABLET | Freq: Three times a day (TID) | ORAL | 1 refills | Status: DC | PRN

## 2019-07-09 MED ORDER — MELOXICAM 15 MG PO TABS: 15.0000 mg | ORAL_TABLET | Freq: Every day | ORAL | 0 refills | Status: DC

## 2019-07-09 NOTE — Patient Instructions (Signed)
° ° ° °  If you have lab work done today you will be contacted with your lab results within the next 2 weeks.  If you have not heard from us then please contact us. The fastest way to get your results is to register for My Chart. ° ° °IF you received an x-ray today, you will receive an invoice from [facility]. Please contact [facility] at [phone] with questions or concerns regarding your invoice.  ° °IF you received labwork today, you will receive an invoice from [facility]. Please contact [facility] at [phone] with questions or concerns regarding your invoice.  ° °Our billing staff will not be able to assist you with questions regarding bills from these companies. ° °You will be contacted with the lab results as soon as they are available. The fastest way to get your results is to activate your My Chart account. Instructions are located on the last page of this paperwork. If you have not heard from us regarding the results in 2 weeks, please contact this office. °  ° ° ° °

## 2019-07-11 NOTE — Progress Notes (Signed)
Established Patient Office Visit  Subjective:  Patient ID: William Daniel, male    DOB: 10/02/1971  Age: 48 y.o. MRN: [id number]  CC:  Chief Complaint  Patient presents with  . Headache    x 4 weeks with some lose bowel after coming from the beach. Also some neck stiffness  . Shoulder Pain    left shoulder x 4 weeks need referral to Ortho for evaluation     HPI William Daniel presents for loose bowels x 3-4 weeks and shoulder and neck pain for around 4 weeks  Pain: onset around 4 weeks ago. Neck down to bilat shoulders. Aching, cramping. Has tried OTCs with minimal relief. Runs regularly, used to lift weight but has stopped during COVID. Spends a lot of time at a desk.   Diarrhea: onset 4 weeks ago. 6-12 loose stools daily. Small in quantity. He notes it has dehydrated him. Pale and watery by the end of the day. No change in diet. No constipation. No blood in stool. No sick contacts. No travel out of state   Past Medical History:  Diagnosis Date  . Essential hypertension 01/22/2015  . GERD (gastroesophageal reflux disease) 02/21/2015   ENT evaluation 02/08/15 - referred for hoarseness and globus sensation. Laryngoscopy showed findings consistent with GERD. Started on omeprazole 40 mg QHS.     Past Surgical History:  Procedure Laterality Date  . APPENDECTOMY    . NO PAST SURGERIES      Family History  Problem Relation Age of Onset  . Diabetes Father   . Heart disease Father   . Alcohol abuse Paternal Grandfather     Social History   Socioeconomic History  . Marital status: Married    Spouse name: Not on file  . Number of children: 3  . Years of education: 36  . Highest education level: Not on file  Occupational History  . Occupation: Self Employed  Social Needs  . Financial resource strain: Not on file  . Food insecurity    Worry: Not on file    Inability: Not on file  . Transportation needs    Medical: Not on file    Non-medical: Not on file  Tobacco  Use  . Smoking status: Former Smoker    Types: Cigarettes  . Smokeless tobacco: Former Neurosurgeon    Types: Chew  Substance and Sexual Activity  . Alcohol use: Yes    Alcohol/week: 0.0 standard drinks    Comment: occasional  . Drug use: No  . Sexual activity: Not on file  Lifestyle  . Physical activity    Days per week: Not on file    Minutes per session: Not on file  . Stress: Not on file  Relationships  . Social Musician on phone: Not on file    Gets together: Not on file    Attends religious service: Not on file    Active member of club or organization: Not on file    Attends meetings of clubs or organizations: Not on file    Relationship status: Not on file  . Intimate partner violence    Fear of current or ex partner: Not on file    Emotionally abused: Not on file    Physically abused: Not on file    Forced sexual activity: Not on file  Other Topics Concern  . Not on file  Social History Narrative   [place] Currently resides in a house  with wife and children. 2 dogs. Fun:  Play sports, run, coach basketball.   Denies religious beliefs effecting healthcare.     Outpatient Medications Prior to Visit  Medication Sig Dispense Refill  . zolpidem (AMBIEN) 5 MG tablet Take 5 mg by mouth at bedtime as needed for sleep.    . clotrimazole-betamethasone (LOTRISONE) cream Apply 1 application topically 2 (two) times daily. 30 g 0   No facility-administered medications prior to visit.     No Known Allergies  ROS Review of Systems  Constitutional: Negative.   HENT: Negative.   Eyes: Negative.   Respiratory: Negative.   Cardiovascular: Negative.   Gastrointestinal: Positive for diarrhea. Negative for abdominal distention, abdominal pain, anal bleeding, blood in stool, constipation, nausea, rectal pain and vomiting.  Endocrine: Negative.   Genitourinary: Negative.   Musculoskeletal: Positive for arthralgias, myalgias, neck pain and neck  stiffness.  Skin: Negative.   Allergic/Immunologic: Negative.   Neurological: Negative.   Hematological: Negative.   Psychiatric/Behavioral: Negative.   All other systems reviewed and are negative.     Objective:    Physical Exam  Constitutional: He is oriented to person, place, and time. He appears well-developed and well-nourished. No distress.  Neck: No tracheal deviation present. No thyromegaly present.  Cardiovascular: Normal rate, regular rhythm, normal heart sounds and intact distal pulses. Exam reveals no gallop and no friction rub.  No murmur heard. Pulmonary/Chest: Effort normal. No respiratory distress.  Abdominal: Soft. Bowel sounds are normal. He exhibits no distension and no mass. There is no abdominal tenderness. There is no rebound and no guarding.  Musculoskeletal: Normal range of motion.        General: Tenderness (c spine) present. No deformity or edema.  Neurological: He is alert and oriented to person, place, and time.  Skin: Skin is warm and dry. No rash noted. He is not diaphoretic. No erythema. No pallor.  Psychiatric: He has a normal mood and affect. His behavior is normal. Judgment and thought content normal.  Nursing note and vitals reviewed.   BP (!) 142/88   Pulse 83   Temp 98 F (36.7 C) (Oral)   Resp 16   Ht 5' 11.85" (1.825 m)   Wt 176 lb (79.8 kg)   SpO2 100%   BMI 23.97 kg/m  Wt Readings from Last 3 Encounters:  07/09/19 176 lb (79.8 kg)  08/06/17 180 lb (81.6 kg)  08/10/15 182 lb (82.6 kg)     There are no preventive care reminders to display for this patient.  There are no preventive care reminders to display for this patient.  Lab Results  Component Value Date   TSH 1.03 10/22/2014   Lab Results  Component Value Date   WBC 6.3 04/05/2015   HGB 14.0 04/05/2015   HCT 41.0 04/05/2015   MCV 89.1 04/05/2015   PLT 173 04/05/2015   Lab Results  Component Value Date   NA 138 04/05/2015   K 4.2 04/05/2015   CO2 26 04/05/2015    GLUCOSE 102 (H) 04/05/2015   BUN 17 04/05/2015   CREATININE 0.80 04/05/2015   BILITOT 0.5 04/05/2015   ALKPHOS 62 04/05/2015   AST 30 04/05/2015   ALT 23 04/05/2015   PROT 7.6 04/05/2015   ALBUMIN 4.3 04/05/2015   CALCIUM 9.7 04/05/2015   GFR 98.22 10/22/2014   No results found for: CHOL No results found for: HDL No results found for: LDLCALC No results found for: TRIG No results found for: [facility]OLHDL Lab Results  Component Value Date   HGBA1C 5.9 04/05/2015      Assessment & Plan:   Problem List Items Addressed This Visit    None    Visit Diagnoses    Neck pain    -  Primary   Relevant Medications   meloxicam (MOBIC) 15 MG tablet   cyclobenzaprine (FLEXERIL) 5 MG tablet   Other Relevant Orders   DG Cervical Spine Complete (Completed)   Diarrhea of presumed infectious origin       Relevant Medications   azithromycin (ZITHROMAX) 500 MG tablet      Meds ordered this encounter  Medications  . azithromycin (ZITHROMAX) 500 MG tablet    Sig: Take 1 tablet (500 mg total) by mouth daily.    Dispense:  3 tablet    Refill:  0    Order Specific Question:   Supervising Provider    Answer:   Delia Chimes A [id number]  . meloxicam (MOBIC) 15 MG tablet    Sig: Take 1 tablet (15 mg total) by mouth daily.    Dispense:  30 tablet    Refill:  0    Order Specific Question:   Supervising Provider    Answer:   Delia Chimes A [id number]  . cyclobenzaprine (FLEXERIL) 5 MG tablet    Sig: Take 1 tablet (5 mg total) by mouth 3 (three) times daily as needed for muscle spasms.    Dispense:  30 tablet    Refill:  1    Order Specific Question:   Supervising Provider    Answer:   Forrest Moron [id number]    Follow-up: No follow-ups on file.   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 Given meloxicam and flexeril, suggest PT, pt declines PT for now, would prefer to stretch and strengthen at home  Diarrhea: concern for infectious  origin, will try abx and follow up  Patient encouraged to call clinic with any questions, comments, or concerns.   Maximiano Coss, NP

## 2019-07-22 ENCOUNTER — Other Ambulatory Visit: Payer: Self-pay

## 2019-07-22 DIAGNOSIS — Z20822 Contact with and (suspected) exposure to covid-19: Secondary | ICD-10-CM

## 2019-07-24 LAB — NOVEL CORONAVIRUS, NAA: SARS-CoV-2, NAA: NOT DETECTED

## 2019-10-28 ENCOUNTER — Ambulatory Visit: Payer: 59 | Attending: Internal Medicine

## 2019-10-28 DIAGNOSIS — Z20822 Contact with and (suspected) exposure to covid-19: Secondary | ICD-10-CM

## 2019-10-30 LAB — NOVEL CORONAVIRUS, NAA: SARS-CoV-2, NAA: NOT DETECTED

## 2020-01-08 ENCOUNTER — Emergency Department (HOSPITAL_COMMUNITY): Payer: 59

## 2020-01-08 ENCOUNTER — Emergency Department (HOSPITAL_COMMUNITY)
Admission: EM | Admit: 2020-01-08 | Discharge: 2020-01-08 | Disposition: A | Discharge status: Home / Self Care | Payer: 59 | Attending: Emergency Medicine | Admitting: Emergency Medicine

## 2020-01-08 ENCOUNTER — Encounter (HOSPITAL_COMMUNITY): Payer: Self-pay | Admitting: Emergency Medicine

## 2020-01-08 ENCOUNTER — Other Ambulatory Visit: Payer: Self-pay

## 2020-01-08 DIAGNOSIS — Z79899 Other long term (current) drug therapy: Secondary | ICD-10-CM | POA: Insufficient documentation

## 2020-01-08 DIAGNOSIS — S0990XA Unspecified injury of head, initial encounter: Secondary | ICD-10-CM | POA: Diagnosis present

## 2020-01-08 DIAGNOSIS — Y999 Unspecified external cause status: Secondary | ICD-10-CM | POA: Insufficient documentation

## 2020-01-08 DIAGNOSIS — W010XXA Fall on same level from slipping, tripping and stumbling without subsequent striking against object, initial encounter: Secondary | ICD-10-CM | POA: Diagnosis not present

## 2020-01-08 DIAGNOSIS — Y9301 Activity, walking, marching and hiking: Secondary | ICD-10-CM | POA: Insufficient documentation

## 2020-01-08 DIAGNOSIS — Y929 Unspecified place or not applicable: Secondary | ICD-10-CM | POA: Diagnosis not present

## 2020-01-08 DIAGNOSIS — Z87891 Personal history of nicotine dependence: Secondary | ICD-10-CM | POA: Insufficient documentation

## 2020-01-08 DIAGNOSIS — S060X0A Concussion without loss of consciousness, initial encounter: Secondary | ICD-10-CM | POA: Diagnosis not present

## 2020-01-08 DIAGNOSIS — I10 Essential (primary) hypertension: Secondary | ICD-10-CM | POA: Diagnosis not present

## 2020-01-08 MED ORDER — LISINOPRIL 20 MG PO TABS: 20.0000 mg | ORAL_TABLET | Freq: Every day | ORAL | 0 refills | Status: DC

## 2020-01-08 NOTE — Discharge Instructions (Addendum)
Follow up with [facility] next week.  Follow up with your family md for your bp

## 2020-01-08 NOTE — ED Triage Notes (Signed)
Larey Seat last Thursday and hit posterior head last "somewhat had some loss of consciousness". Reports next day was nausea and vomiting. Reports since having issues with balance, feels like eyes roll around in back of head when laying supine, feels dizzy with standing. Reports slight headache.

## 2020-01-12 NOTE — ED Provider Notes (Signed)
[facility] DEPT Provider Note   CSN: [id number] Arrival date & time: 01/08/20  1715     History Chief Complaint  Patient presents with  . Head Injury  . Dizziness  . Headache    William Daniel is a 49 y.o. male.  Patient states that he slipped and fell a few days ago hit the back of his head.  He may have had loss of consciousness.  Ever since then he has felt dizzy and weak  The history is provided by the patient. No language interpreter was used.  Head Injury Location:  Generalized Mechanism of injury: fall   Fall:    Fall occurred:  Standing   Impact surface:  Hard floor   Point of impact:  Head   Entrapped after fall: no   Pain details:    Quality:  Aching   Severity:  Moderate   Timing:  Constant   Progression:  Worsening Associated symptoms: headache   Associated symptoms: no seizures   Dizziness Associated symptoms: headaches   Associated symptoms: no chest pain and no diarrhea   Headache Associated symptoms: dizziness   Associated symptoms: no abdominal pain, no back pain, no congestion, no cough, no diarrhea, no fatigue, no seizures and no sinus pressure        Past Medical History:  Diagnosis Date  . Essential hypertension 01/22/2015  . GERD (gastroesophageal reflux disease) 02/21/2015   ENT evaluation 02/08/15 - referred for hoarseness and globus sensation. Laryngoscopy showed findings consistent with GERD. Started on omeprazole 40 mg QHS.     Patient Active Problem List   Diagnosis Date Noted  . Tinea cruris 08/10/2015  . GERD (gastroesophageal reflux disease) 02/21/2015  . Essential hypertension 01/22/2015  . Rash and nonspecific skin eruption 10/22/2014  . Dizziness 10/22/2014  . Fatigue 10/22/2014  . CHEST PAIN 10/03/2010    Past Surgical History:  Procedure Laterality Date  . APPENDECTOMY    . NO PAST SURGERIES         Family History  Problem Relation Age of Onset  . Diabetes Father   . Heart  disease Father   . Alcohol abuse Paternal Grandfather     Social History   Tobacco Use  . Smoking status: Former Smoker    Types: Cigarettes  . Smokeless tobacco: Former Systems developer    Types: Chew  Substance Use Topics  . Alcohol use: Yes    Alcohol/week: 0.0 standard drinks    Comment: occasional  . Drug use: No    Home Medications Prior to Admission medications   Medication Sig Start Date End Date Taking? Authorizing Provider  zolpidem (AMBIEN) 5 MG tablet Take 2.5 mg by mouth at bedtime as needed for sleep.    Yes [provider]  azithromycin (ZITHROMAX) 500 MG tablet Take 1 tablet (500 mg total) by mouth daily. Patient not taking: Reported on 01/08/2020 07/09/19   Maximiano Coss, NP  cyclobenzaprine (FLEXERIL) 5 MG tablet Take 1 tablet (5 mg total) by mouth 3 (three) times daily as needed for muscle spasms. Patient not taking: Reported on 01/08/2020 07/09/19   Maximiano Coss, NP  lisinopril (ZESTRIL) 20 MG tablet Take 1 tablet (20 mg total) by mouth daily. 01/08/20   Milton Ferguson, MD  meloxicam (MOBIC) 15 MG tablet Take 1 tablet (15 mg total) by mouth daily. Patient not taking: Reported on 01/08/2020 07/09/19   Maximiano Coss, NP    Allergies    Patient has no known allergies.  Review of  Systems   Review of Systems  Constitutional: Negative for appetite change and fatigue.  HENT: Negative for congestion, ear discharge and sinus pressure.   Eyes: Negative for discharge.  Respiratory: Negative for cough.   Cardiovascular: Negative for chest pain.  Gastrointestinal: Negative for abdominal pain and diarrhea.  Genitourinary: Negative for frequency and hematuria.  Musculoskeletal: Negative for back pain.  Skin: Negative for rash.  Neurological: Positive for dizziness and headaches. Negative for seizures.  Psychiatric/Behavioral: Negative for hallucinations.    Physical Exam Updated Vital Signs BP (!) 145/109   Pulse 61   Temp 98.2 F (36.8 C) (Oral)   Resp 13    SpO2 100%   Physical Exam Vitals reviewed.  Constitutional:      Appearance: He is well-developed.  HENT:     Head: Normocephalic.     Comments: Tenderness to occipital area    Nose: Nose normal.  Eyes:     General: No scleral icterus.    Conjunctiva/sclera: Conjunctivae normal.  Neck:     Thyroid: No thyromegaly.  Cardiovascular:     Rate and Rhythm: Normal rate and regular rhythm.     Heart sounds: No murmur. No friction rub. No gallop.   Pulmonary:     Breath sounds: No stridor. No wheezing or rales.  Chest:     Chest wall: No tenderness.  Abdominal:     General: There is no distension.     Tenderness: There is no abdominal tenderness. There is no rebound.  Musculoskeletal:        General: Normal range of motion.     Cervical back: Neck supple.  Lymphadenopathy:     Cervical: No cervical adenopathy.  Skin:    Findings: No erythema or rash.  Neurological:     Mental Status: He is oriented to person, place, and time.     Motor: No abnormal muscle tone.     Coordination: Coordination normal.  Psychiatric:        Behavior: Behavior normal.     ED Results / Procedures / Treatments   Labs (all labs ordered are listed, but only abnormal results are displayed) Labs Reviewed - No data to display  EKG None  Radiology No results found.  Procedures Procedures (including critical care time)  Medications Ordered in ED Medications - No data to display  ED Course  I have reviewed the triage vital signs and the nursing notes.  Pertinent labs & imaging results that were available during my care of the patient were reviewed by me and considered in my medical decision making (see chart for details).    MDM Rules/Calculators/A&P                      Patient with a fall and head injury.  Patient probably has mild concussion syndrome.  He is referred to neurology for follow-up Final Clinical Impression(s) / ED Diagnoses Final diagnoses:  Concussion without loss of  consciousness, initial encounter  Essential hypertension    Rx / DC Orders ED Discharge Orders         Ordered    lisinopril (ZESTRIL) 20 MG tablet  Daily     01/08/20 2127           Bethann Berkshire, MD 01/12/20 1000

## 2020-03-10 ENCOUNTER — Encounter: Payer: Self-pay | Admitting: Neurology

## 2020-03-10 ENCOUNTER — Telehealth: Payer: Self-pay | Admitting: Neurology

## 2020-03-10 ENCOUNTER — Ambulatory Visit: Payer: 59 | Admitting: Neurology

## 2020-03-10 ENCOUNTER — Other Ambulatory Visit: Payer: Self-pay

## 2020-03-10 VITALS — BP 141/78 | HR 73 | Ht 72.0 in | Wt 176.5 lb

## 2020-03-10 DIAGNOSIS — H8110 Benign paroxysmal vertigo, unspecified ear: Secondary | ICD-10-CM | POA: Diagnosis not present

## 2020-03-10 DIAGNOSIS — F0781 Postconcussional syndrome: Secondary | ICD-10-CM | POA: Diagnosis not present

## 2020-03-10 DIAGNOSIS — G44309 Post-traumatic headache, unspecified, not intractable: Secondary | ICD-10-CM | POA: Insufficient documentation

## 2020-03-10 MED ORDER — LISINOPRIL 10 MG PO TABS: 10.0000 mg | ORAL_TABLET | Freq: Every day | ORAL | 3 refills | Status: DC

## 2020-03-10 NOTE — Telephone Encounter (Signed)
I sent this back in to the pharmacy

## 2020-03-10 NOTE — Telephone Encounter (Signed)
Pt called stating that the pharmacy informed him that his lisinopril (ZESTRIL) 10 MG tablet has not been called in for him yet to the [facility]. Please advise.

## 2020-03-10 NOTE — Progress Notes (Signed)
[facility]  PATIENT: William Daniel DOB: 08/14/1971  REFERRING DOCTOR OR PCP:  Maximiano Coss, NP SOURCE: patient, notes from PCP, imaging reports, CT scan of the head personally reviewed  _________________________________   HISTORICAL  CHIEF COMPLAINT:  Chief Complaint  Patient presents with  . New Patient (Initial Visit)    RM 13, alone. [facility] ED referral  for dizziness/headaches post head injury. Pt was there 01/08/20. He slipped and fell and hit head and then started having these sx after for about 7-10 days. Sx intermitent since and is daily. Usually occurs when laying down/bending over and looking up. He is a runner, ran out of BP medication. He monitors at home and it has been elevated.     HISTORY OF PRESENT ILLNESS:  I had the pleasure seeing your patient, William Daniel, at [facility] for neurologic consultation regarding dizziness and headaches after a head injury.  In March, 2021, he slipped and fell.  He hit the back of the head on the wall or floor. He may have lost consciousness for seconds.    That night he became nauseous and remained so for a few days.  He notes when he laid flat and turned his head quickly, he had vertigo and his eyes would jump around.  This improved after sitting and stating still.    He felt he was unable to sleep on his bac.   He went to the ED 01/08/2020.   While there he had a CT scan of the head and cervical spine.  The CT scan was read as normal.  The cervical spine showed degeneration with possible spinal stenosis at C5-C6.  He felt he ws continuing to improve over the next few weeks.   However, over the past 2 weeks, he is noting more vertigo, poor balance.  If he crouches down (look under sink, tie hoes), he gets vertigo.   H notes neck pain and stiffness.    He continues to experience intermittent headaches and vertigo.   The vertigo occurs almost every time he bends over.   It bothers him less  with standing, running or sitting.    Headaches are mild and not too bothersome.   He has hypertension but stopped his medication recently.   He is very physically active and runs 3-4 days a week.   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      REVIEW OF SYSTEMS: Constitutional: No fevers, chills, sweats, or change in appetite.  Some insomnia Eyes: No visual changes, double vision, eye pain Ear, nose and throat: No hearing loss, ear pain, nasal congestion, sore throat Cardiovascular: No chest pain, palpitations Respiratory: No shortness of breath at rest or with exertion.   No wheezes GastrointestinaI: No nausea, vomiting, diarrhea, abdominal pain, fecal incontinence Genitourinary: No dysuria, urinary retention or frequency.  No nocturia. Musculoskeletal: No neck pain, back pain Integumentary: No rash, pruritus, skin lesions Neurological: as above Psychiatric: No depression at this time.  No anxiety Endocrine: No palpitations, diaphoresis, change in appetite, change in weigh or increased thirst Hematologic/Lymphatic: No anemia, purpura, petechiae. Allergic/Immunologic: No itchy/runny eyes, nasal congestion, recent allergic reactions, rashes  ALLERGIES: No Known Allergies  HOME MEDICATIONS:  Current Outpatient Medications:  .  zolpidem (AMBIEN) 5 MG tablet, Take 2.5 mg by mouth at bedtime as needed for sleep. , Disp: , Rfl:  .  lisinopril (ZESTRIL) 20 MG tablet, Take 1 tablet (20 mg total) by mouth daily. (Patient not  taking: Reported on 03/10/2020), Disp: 30 tablet, Rfl: 0  PAST MEDICAL HISTORY: Past Medical History:  Diagnosis Date  . Essential hypertension 01/22/2015  . GERD (gastroesophageal reflux disease) 02/21/2015   ENT evaluation 02/08/15 - referred for hoarseness and globus sensation. Laryngoscopy showed findings consistent with GERD. Started on omeprazole 40 mg QHS.     PAST SURGICAL HISTORY: Past Surgical History:  Procedure Laterality Date  .  APPENDECTOMY    . NO PAST SURGERIES      FAMILY HISTORY: Family History  Problem Relation Age of Onset  . Diabetes Father   . Heart disease Father   . Alcohol abuse Paternal Grandfather     SOCIAL HISTORY:  Social History   Socioeconomic History  . Marital status: Married    Spouse name: Not on file  . Number of children: 3  . Years of education: 56  . Highest education level: Not on file  Occupational History  . Occupation: Self Employed  Tobacco Use  . Smoking status: Former Smoker    Types: Cigarettes  . Smokeless tobacco: Former Systems developer    Types: Chew  Substance and Sexual Activity  . Alcohol use: Yes    Alcohol/week: 0.0 standard drinks    Comment: occasional  . Drug use: No  . Sexual activity: Not on file  Other Topics Concern  . Not on file  Social History Narrative   Live   Caffeine use:    Born in [place] and raised in [place].    Currently resides in a house with wife and children.    2 dogs.    Fun:  Play sports, run, coach basketball.   Denies religious beliefs effecting healthcare.    Social Determinants of Health   Financial Resource Strain:   . Difficulty of Paying Living Expenses:   Food Insecurity:   . Worried About Charity fundraiser in the Last Year:   . Arboriculturist in the Last Year:   Transportation Needs:   . Film/video editor (Medical):   Marland Kitchen Lack of Transportation (Non-Medical):   Physical Activity:   . Days of Exercise per Week:   . Minutes of Exercise per Session:   Stress:   . Feeling of Stress :   Social Connections:   . Frequency of Communication with Friends and Family:   . Frequency of Social Gatherings with Friends and Family:   . Attends Religious Services:   . Active Member of Clubs or Organizations:   . Attends Archivist Meetings:   Marland Kitchen Marital Status:   Intimate Partner Violence:   . Fear of Current or Ex-Partner:   . Emotionally Abused:   Marland Kitchen Physically Abused:   . Sexually Abused:      PHYSICAL  EXAM  Vitals:   03/10/20 1024  BP: (!) 141/78  Pulse: 73  SpO2: 96%  Weight: 176 lb 8 oz (80.1 kg)  Height: 6' (1.829 m)    Body mass index is 23.94 kg/m.   General: The patient is well-developed and well-nourished and in no acute distress  HEENT:  Head is Combs/AT.  Sclera are anicteric.  Funduscopic exam shows normal optic discs and retinal vessels.  He has cerumen in the right ear canal  Neck: No carotid bruits are noted.    Cardiovascular: The heart has a regular rate and rhythm with a normal S1 and S2. There were no murmurs, gallops or rubs.    Skin: Extremities are without rash or edema.  Musculoskeletal:  Back is nontender  Neurologic Exam  Mental status: The patient is alert and oriented x 3 at the time of the examination. The patient has apparent normal recent and remote memory, with an apparently normal attention span and concentration ability.   Speech is normal.  Cranial nerves: Extraocular movements are full. Pupils are equal, round, and reactive to light and accomodation.  Visual fields are full.  Facial symmetry is present. There is good facial sensation to soft touch bilaterally.Facial strength is normal.  Trapezius and sternocleidomastoid strength is normal. No dysarthria is noted.   No obvious hearing deficits are noted.  Weber is midline  Motor:  Muscle bulk is normal.   Tone is normal. Strength is  5 / 5 in all 4 extremities.   Sensory: Sensory testing is intact to pinprick, soft touch and vibration sensation in all 4 extremities.  Coordination: Cerebellar testing reveals good finger-nose-finger and heel-to-shin bilaterally.  Gait and station: Station is normal.   Gait is normal. Tandem gait is normal. Romberg is negative.   Reflexes: Deep tendon reflexes are symmetric and normal bilaterally.    Maneuvers:  Dix-Hallpike maneuvers did not evoke nystagmus, though he had a few jerks of nystagmus upon sitting up.     DIAGNOSTIC DATA (LABS, IMAGING,  TESTING) - I reviewed patient records, labs, notes, testing and imaging myself where available.  Lab Results  Component Value Date   WBC 6.3 04/05/2015   HGB 14.0 04/05/2015   HCT 41.0 04/05/2015   MCV 89.1 04/05/2015   PLT 173 04/05/2015      Component Value Date/Time   NA 138 04/05/2015 1909   K 4.2 04/05/2015 1909   CL 101 04/05/2015 1909   CO2 26 04/05/2015 1909   GLUCOSE 102 (H) 04/05/2015 1909   BUN 17 04/05/2015 1909   CREATININE 0.80 04/05/2015 1909   CALCIUM 9.7 04/05/2015 1909   PROT 7.6 04/05/2015 1909   ALBUMIN 4.3 04/05/2015 1909   AST 30 04/05/2015 1909   ALT 23 04/05/2015 1909   ALKPHOS 62 04/05/2015 1909   BILITOT 0.5 04/05/2015 1909   GFRNONAA >60 09/28/2010 1335   GFRAA  09/28/2010 1335    >60        The eGFR has been calculated using the MDRD equation. This calculation has not been validated in all clinical situations. eGFR's persistently <60 mL/min signify possible Chronic Kidney Disease.   No results found for: CHOL, HDL, LDLCALC, LDLDIRECT, TRIG, CHOLHDL Lab Results  Component Value Date   HGBA1C 5.9 04/05/2015   No results found for: [id number] Lab Results  Component Value Date   TSH 1.03 10/22/2014       ASSESSMENT AND PLAN  No diagnosis found.   In summary, Mr. Rivkin is a 49 year old man with post-concussive headache and vertigo.   The vertigo is consistent with benign paroxysmal positional vertigo, due to the trauma.   His headache is consistent with an improving post-concussive syndrome.    The right ear wax might be worsening symptoms.  He will start doing Brandt-Daroff exercises and obtain OTC earwax removal drops.   If not better after 3-4 weeks, we will consider an MRI to further evaluate.  He will return as needed or call if new or worsening neurologic symptoms  Thank you for asking me to see Mr. Spagnuolo.  Please let him know if I can be of further assistance with him or other patients in the future.    Ronnita Paz A. Felecia Shelling,  MD, [facility] 03/10/2020,  14:97 AM Certified in Neurology, Clinical Neurophysiology, Sleep Medicine and Neuroimaging  [facility] [address] [place], [place] [address] [phone]

## 2020-03-10 NOTE — Telephone Encounter (Signed)
Called pt. Advised MD called in rx for him. He verbalized understanding.

## 2021-01-21 IMAGING — CT CT CERVICAL SPINE W/O CM
3 of 4 series · 12 of 33 positions shown, 14 images · non-contrast
Comparison: Head CT today reported separately.

CLINICAL DATA: 48-year-old male status post fall last week striking
posterior head. Subsequent nausea, vomiting, loss of balance.

EXAM:
CT CERVICAL SPINE WITHOUT CONTRAST
TECHNIQUE: Multidetector CT imaging of the cervical spine was performed without
intravenous contrast. Multiplanar CT image reconstructions were also
generated.

[Series 5: sagittal bone · sagittal · 0.33mm/px · 5 of 47 slices shown, 6 images]
[im 16/47  bone]
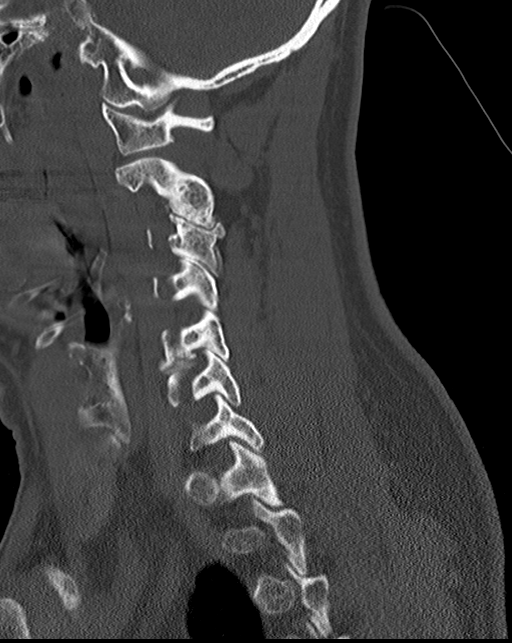
[im 20/47  bone]
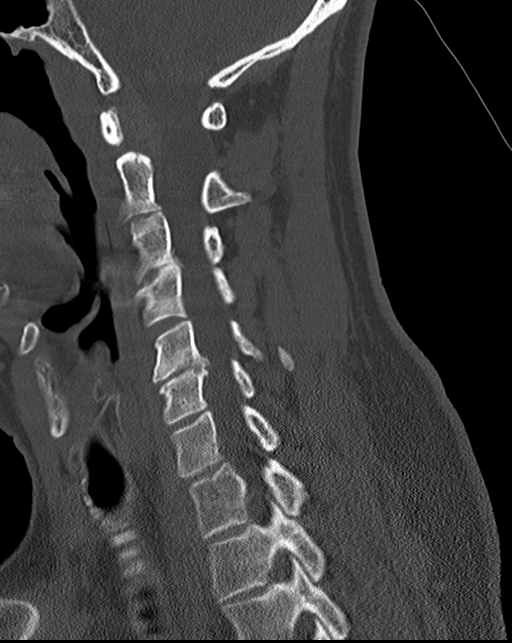
[im 24/47  soft-tissue]
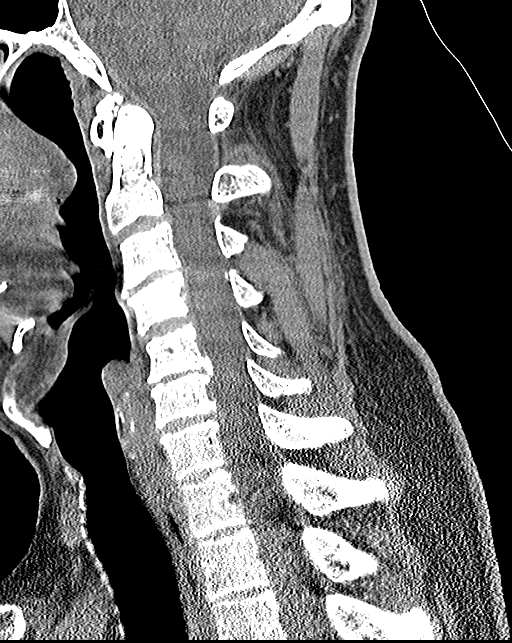
[im 24/47  bone]
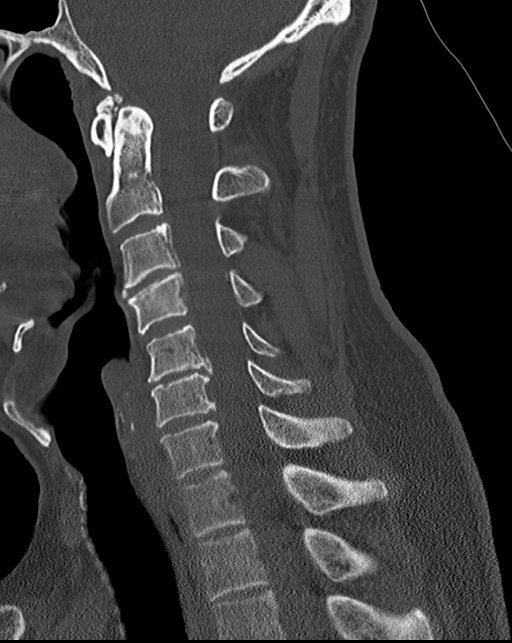
[im 27/47  bone]
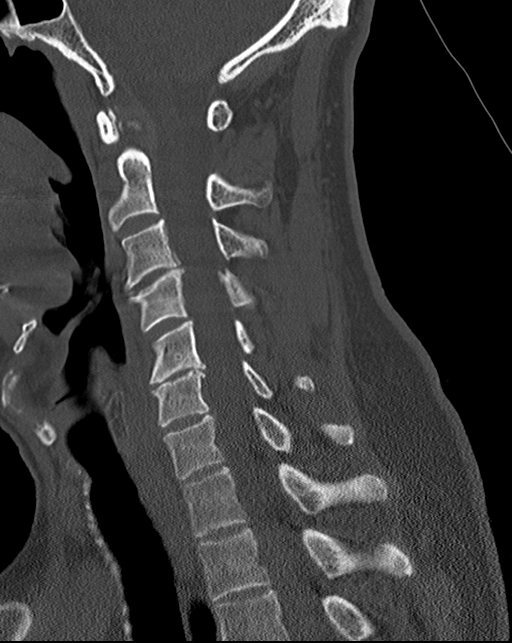
[im 31/47  bone]
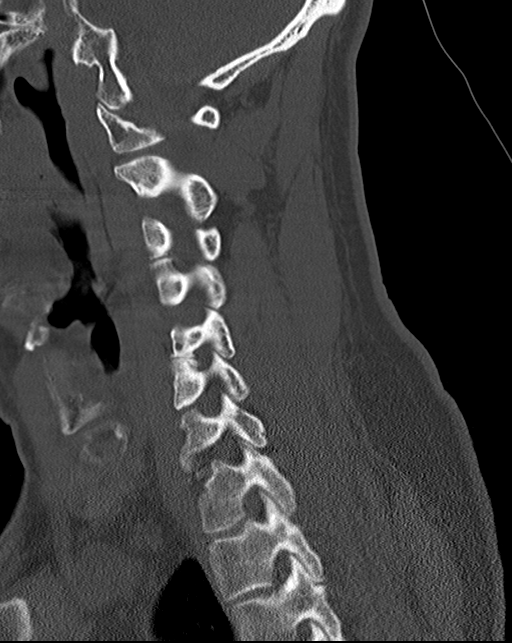

[Series 6: coronal bone · coronal · 0.32mm/px · 3 of 39 slices shown]
[im 8/39  bone]
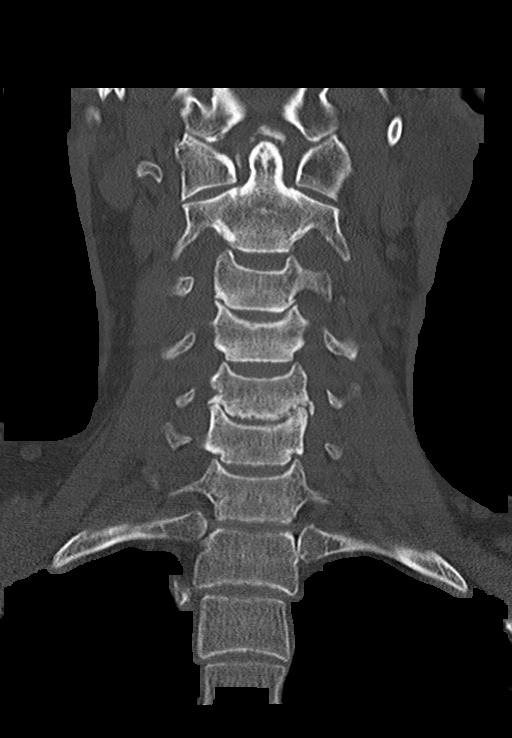
[im 16/39  bone]
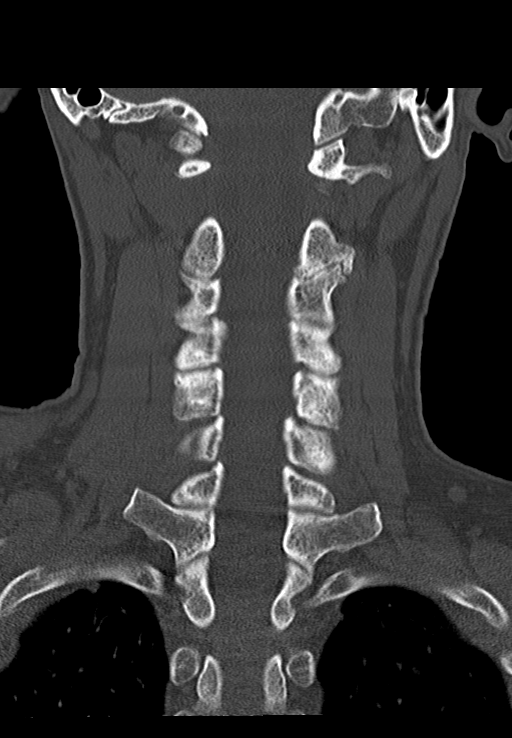
[im 23/39  bone]
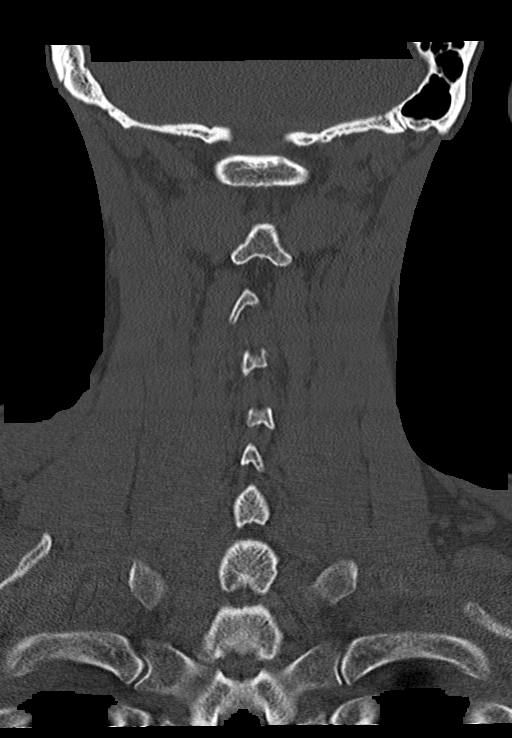

[Series 7: orthogonal bone · axial · 0.23mm/px · z∈[+1086,+1221]mm · 4 of 105 slices shown, 5 images]
[im 18/105  soft-tissue]
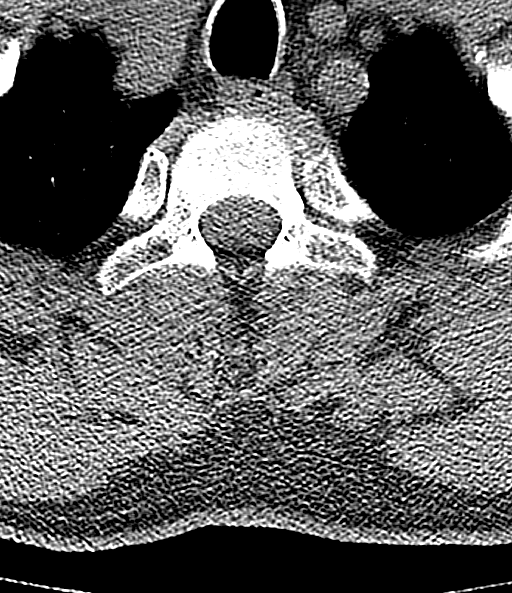
[im 18/105  bone]
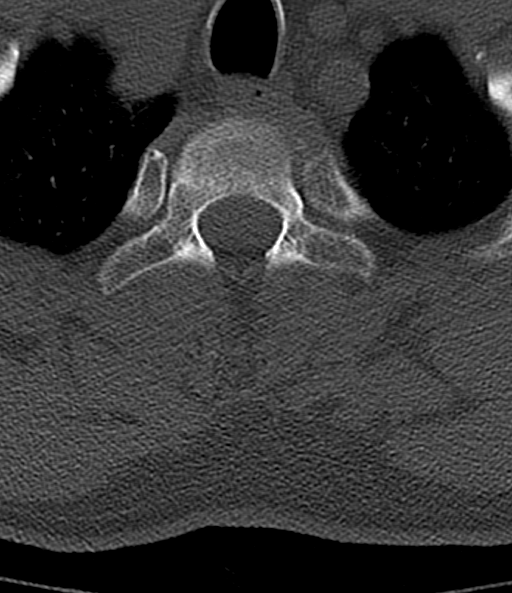
[im 35/105  bone]
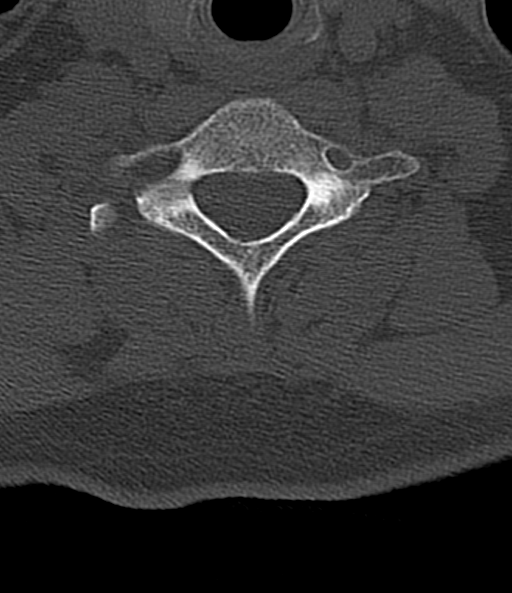
[im 70/105  bone]
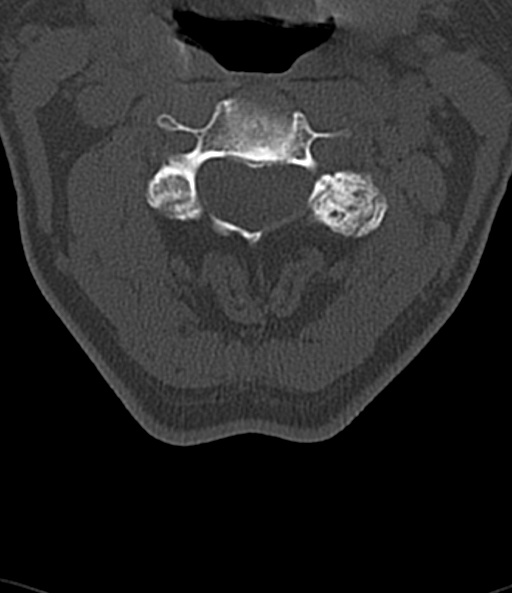
[im 87/105  bone]
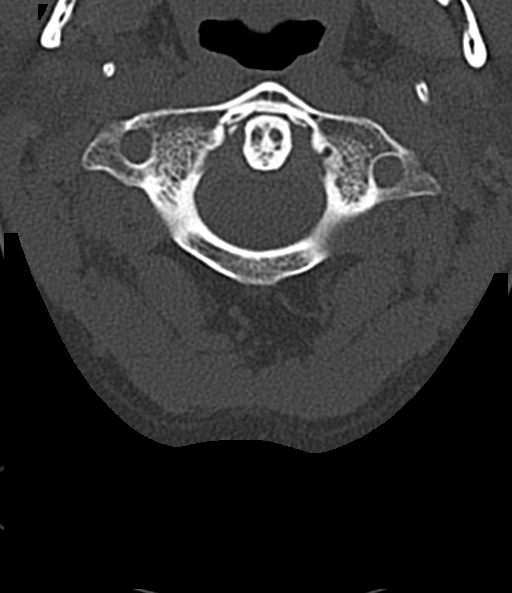

[12 of 33 positions shown; findings below may reference images not displayed]

FINDINGS: Alignment: Straightening of cervical lordosis. Cervicothoracic
junction alignment is within normal limits. Bilateral posterior
element alignment is within normal limits.

Skull base and vertebrae: Mild motion artifact at C3-C4.

Visualized skull base is intact. No atlanto-occipital dissociation.
No acute osseous abnormality identified.

Soft tissues and spinal canal: No prevertebral fluid or swelling. No
visible canal hematoma. Negative noncontrast neck soft tissues.

Disc levels: Moderate upper cervical facet degeneration on the left.
Chronic disc and endplate degeneration at C3-C4 and C5-C6. Up to
mild degenerative cervical spinal stenosis at the latter.

Upper chest: Visible upper thoracic levels appear intact. Negative
lung apices.
IMPRESSION: 1. No acute traumatic injury identified in the cervical spine. Mild
motion artifact at C3-C4.
2. Cervical spine degeneration with mild spinal stenosis suspected
at C5-C6.

## 2021-03-22 ENCOUNTER — Other Ambulatory Visit: Payer: Self-pay | Admitting: Neurology

## 2021-05-26 ENCOUNTER — Other Ambulatory Visit: Payer: Self-pay | Admitting: Neurology

## 2023-08-22 ENCOUNTER — Other Ambulatory Visit (HOSPITAL_BASED_OUTPATIENT_CLINIC_OR_DEPARTMENT_OTHER): Payer: Self-pay | Admitting: Pain Medicine

## 2023-08-22 DIAGNOSIS — E7800 Pure hypercholesterolemia, unspecified: Secondary | ICD-10-CM

## 2023-08-22 DIAGNOSIS — R7303 Prediabetes: Secondary | ICD-10-CM

## 2023-08-30 ENCOUNTER — Encounter (HOSPITAL_COMMUNITY): Payer: Self-pay

## 2023-08-30 ENCOUNTER — Ambulatory Visit (HOSPITAL_COMMUNITY): Admission: RE | Admit: 2023-08-30 | Payer: Self-pay | Source: Ambulatory Visit
